# Patient Record
Sex: Male | Born: 2000 | Race: White | Hispanic: No | Marital: Single | Smoking: Never smoker
Health system: Southern US, Community
[De-identification: ages and names within clinical notes are randomized; demographics above are authoritative.]

## PROBLEM LIST (undated history)

## (undated) DIAGNOSIS — F909 Attention-deficit hyperactivity disorder, unspecified type: Secondary | ICD-10-CM

## (undated) DIAGNOSIS — H02401 Unspecified ptosis of right eyelid: Secondary | ICD-10-CM

## (undated) HISTORY — PX: STRABISMUS SURGERY: SHX218

## (undated) HISTORY — PX: PTOSIS REPAIR: SHX6568

## (undated) HISTORY — DX: Unspecified ptosis of right eyelid: H02.401

## (undated) HISTORY — DX: Attention-deficit hyperactivity disorder, unspecified type: F90.9

## (undated) HISTORY — PX: EYE MUSCLE SURGERY: SHX370

---
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
## 2021-02-23 ENCOUNTER — Ambulatory Visit: Payer: BC Managed Care – PPO | Admitting: Family

## 2021-02-23 ENCOUNTER — Other Ambulatory Visit: Payer: Self-pay

## 2021-02-23 ENCOUNTER — Encounter: Payer: Self-pay | Admitting: Family

## 2021-02-23 VITALS — BP 102/60 | HR 68 | Resp 16 | Ht 72.24 in | Wt 161.8 lb

## 2021-02-23 DIAGNOSIS — H02401 Unspecified ptosis of right eyelid: Secondary | ICD-10-CM | POA: Diagnosis not present

## 2021-02-23 DIAGNOSIS — F902 Attention-deficit hyperactivity disorder, combined type: Secondary | ICD-10-CM

## 2021-02-23 DIAGNOSIS — H547 Unspecified visual loss: Secondary | ICD-10-CM | POA: Diagnosis not present

## 2021-02-23 DIAGNOSIS — Z7189 Other specified counseling: Secondary | ICD-10-CM

## 2021-02-23 DIAGNOSIS — R278 Other lack of coordination: Secondary | ICD-10-CM

## 2021-02-23 DIAGNOSIS — Z818 Family history of other mental and behavioral disorders: Secondary | ICD-10-CM

## 2021-02-23 DIAGNOSIS — Z719 Counseling, unspecified: Secondary | ICD-10-CM

## 2021-02-23 DIAGNOSIS — Z79899 Other long term (current) drug therapy: Secondary | ICD-10-CM

## 2021-02-23 DIAGNOSIS — H509 Unspecified strabismus: Secondary | ICD-10-CM

## 2021-02-23 MED ORDER — METHYLPHENIDATE HCL ER (LA) 40 MG PO CP24: 40.0000 mg | ORAL_CAPSULE | ORAL | 0 refills | Status: DC

## 2021-02-23 MED ORDER — METHYLPHENIDATE HCL 5 MG PO TABS: ORAL_TABLET | ORAL | 0 refills | Status: DC

## 2021-02-23 NOTE — Progress Notes (Signed)
[place] [facility] [place] [facility] [address] [place] [address] Dept: [phone] Dept Fax: [phone] Loc: [phone] Loc Fax: [phone]  Medication Check  Patient ID: Daryl Allen, male  DOB: 02-17-2001, 20 y.o.  MRN: [id number]  Date of Evaluation: 02/23/2021 PCP: Eliberto Ivory, MD  Accompanied by:  self Patient Lives with: parents  HISTORY/CURRENT STATUS: HPI Patient here by himself for the visit today. Patient interactive and appropriate with provider today. Patient graduated from school and transferring to [employer] for the Fall semester. Moving in this coming week. Has been consistent with medications and taking daily his long acting Ritalin with short acting used on a PRN basis.   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   MEDICAL HISTORY: Appetite: Good   MVI/Other: None  Sleep:No issues with sleeping Concerns: Initiation/Maintenance/Other: None  Individual Medical History/ Review of Systems: Changes? :None reported. Well check in the past few months.   Allergies: Doxepin  Current Medications: Medication Side Effects: None  Family Medical/ Social History: Changes? None   MENTAL HEALTH: Mental Health Issues:  None  PHYSICAL EXAM; Vitals:  Vitals:   02/23/21 1339  BP: 102/60  Pulse: 68  Resp: 16  Weight: 161 lb 12.8 oz (73.4 kg)  Height: 6' 0.24" (1.835 m)    General Physical Exam: Unchanged from previous exam, date:12/08/2020 Changed:None  DIAGNOSES:    ICD-10-CM   1. ADHD (attention deficit hyperactivity disorder), combined type  F90.2 methylphenidate (RITALIN) 5 MG tablet    methylphenidate (RITALIN LA) 40 MG 24 hr capsule     2. Decreased visual acuity  H54.7     3. Dysgraphia  R27.8     4. Ptosis of right eyelid  H02.401     5. Strabismus  H50.9     6. Medication management  Z79.899     7. Patient counseled  Z71.9     8. Family history of suicide  Z81.8     9. Goals of care, counseling/discussion  Z71.89      ASSESSMENT: Daryl Allen is a 20 year old male with a history of ADHD and symptom control with Ritalin LA 40 mg daily and PRN 5 mg in the evening time. 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 Has had no formal services in place and is not applying for services at college. May consider a job on campus at school, but waiting until spring semester. Had issues with cousin that recently committed suicide and not currently getting counseling. To continue with medication regimen and discuss recent event with family member.   RECOMMENDATIONS:  Supreme provided information regarding school, moving in this week, campus living and preparing for the move to [facility].   Denies the need for services on campus to support learning success. Encouraged to f/u with DEC is needing formal services for his ADHD if academic struggles ensue. Extra help and learning centers are available with tutoring on campus for support.   Discussed activity and possible job on campus next semester. TO continue with routinn exercise on campus a [employer].   Supported healthy eating habits and good variety of foods. Discussed eating patterns and getting enough calories in daily with exercise. Encouraged enough fluids to include water daily with activity.  Counseled medication pharmacokinetics, options, dosage, administration, desired effects, and possible side effects.   Ritalin LA 40 mg daily, # 30 with no RF's Ritalin 5 mg in the afternoon, # 30 with no RF's RX for above e-scribed and sent to pharmacy on record   [id number], [place] - [address] [place] [place]  [address] Phone: [phone] Fax: [phone]   I discussed the assessment and treatment plan with the patient/parent. The patient/parent was provided an opportunity to ask questions and all were answered. The patient/ parent agreed with the plan and demonstrated an understanding of the instructions.   NEXT APPOINTMENT: Return in about 3 months (around 05/26/2021) for f/u visit.  Carron Curie, NP Counseling Time: 28 mins Total Contact Time: 32 mins

## 2021-02-24 ENCOUNTER — Encounter: Payer: Self-pay | Admitting: Family

## 2021-04-13 ENCOUNTER — Other Ambulatory Visit: Payer: Self-pay

## 2021-04-13 DIAGNOSIS — F902 Attention-deficit hyperactivity disorder, combined type: Secondary | ICD-10-CM

## 2021-04-13 MED ORDER — METHYLPHENIDATE HCL 5 MG PO TABS: ORAL_TABLET | ORAL | 0 refills | Status: DC

## 2021-04-13 MED ORDER — METHYLPHENIDATE HCL ER (LA) 40 MG PO CP24: 40.0000 mg | ORAL_CAPSULE | ORAL | 0 refills | Status: DC

## 2021-04-13 NOTE — Telephone Encounter (Signed)
RX for above e-scribed and sent to pharmacy on record ° °[id number] [place], [place] - [address] °[address] °[place] [address] °Phone: [phone] Fax: [phone] ° ° °

## 2021-05-25 ENCOUNTER — Other Ambulatory Visit: Payer: Self-pay

## 2021-05-25 DIAGNOSIS — F902 Attention-deficit hyperactivity disorder, combined type: Secondary | ICD-10-CM

## 2021-05-26 MED ORDER — METHYLPHENIDATE HCL ER (LA) 40 MG PO CP24: 40.0000 mg | ORAL_CAPSULE | ORAL | 0 refills | Status: DC

## 2021-05-26 NOTE — Telephone Encounter (Signed)
Ritalin LA 40 mg daily, # 30 with no RF's.RX for above e-scribed and sent to pharmacy on record  [facility] [phone] [place], [place] - [address] [place] [address] Phone: [phone] Fax: [phone]

## 2021-06-01 ENCOUNTER — Telehealth: Payer: Self-pay | Admitting: Family

## 2021-06-01 NOTE — Telephone Encounter (Signed)
  Emailed records for 02/23/21 to [email or web address].

## 2021-06-04 ENCOUNTER — Telehealth (INDEPENDENT_AMBULATORY_CARE_PROVIDER_SITE_OTHER): Payer: BC Managed Care – PPO | Admitting: Family

## 2021-06-04 ENCOUNTER — Encounter: Payer: Self-pay | Admitting: Family

## 2021-06-04 ENCOUNTER — Other Ambulatory Visit: Payer: Self-pay

## 2021-06-04 DIAGNOSIS — G4700 Insomnia, unspecified: Secondary | ICD-10-CM

## 2021-06-04 DIAGNOSIS — Z719 Counseling, unspecified: Secondary | ICD-10-CM

## 2021-06-04 DIAGNOSIS — H547 Unspecified visual loss: Secondary | ICD-10-CM

## 2021-06-04 DIAGNOSIS — F902 Attention-deficit hyperactivity disorder, combined type: Secondary | ICD-10-CM | POA: Diagnosis not present

## 2021-06-04 DIAGNOSIS — H02401 Unspecified ptosis of right eyelid: Secondary | ICD-10-CM

## 2021-06-04 DIAGNOSIS — Z7189 Other specified counseling: Secondary | ICD-10-CM

## 2021-06-04 DIAGNOSIS — H509 Unspecified strabismus: Secondary | ICD-10-CM

## 2021-06-04 DIAGNOSIS — Z79899 Other long term (current) drug therapy: Secondary | ICD-10-CM

## 2021-06-04 DIAGNOSIS — R278 Other lack of coordination: Secondary | ICD-10-CM

## 2021-06-04 NOTE — Progress Notes (Signed)
[place] [facility] [address] [place] [place] [address] Dept: [phone] Dept Fax: [phone]  Medication Check visit via Virtual Video   Patient ID:  Daryl Allen  male DOB: 2000-12-07   20 y.o.   MRN: [id number]   DATE:06/04/21  PCP: Eliberto Ivory, MD  Virtual Visit via Video Note  Daryl Allen connected with  Daryl Allen on 06/04/21 at  1:30 PM EST by a video enabled telemedicine application and verified that Daryl Allen am speaking with the correct person using two identifiers. Patient/Parent Location: at school   Daryl Allen discussed the limitations, risks, security and privacy concerns of performing an evaluation and management service by telephone and the availability of in person appointments. Daryl Allen also discussed with the parents that there may be a patient responsible charge related to this service. The parents expressed understanding and agreed to proceed.  Provider: Carron Curie, NP  Location: private work location  HPI/CURRENT STATUS: Daryl Allen is here for medication management of the psychoactive medications for ADHD and review of educational and behavioral concerns.   Daryl Allen currently taking Ritalin LA 40 mg daily, which is working well. Takes medication daily in the morning. Medication tends to wear off around evening, then can use the short acting. Daryl Allen is able to focus through school/homework.   Daryl Allen is eating well (eating breakfast, lunch and dinner). Daryl Allen does not have appetite suppression  Sleeping well (getting plenty of sleep), sleeping through the night. 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   MEDICAL HISTORY: Individual Medical History/ Review of Systems: Has had a viral infection with new  environment. Has been healthy with no visits to the PCP. WCC due Yearly.   Family Medical/ Social History: Changes? No Patient Lives with:  1 roommate that is quiet  MENTAL HEALTH: Mental Health Issues: Anxiety-less now  Allergies: Allergies  Allergen Reactions   Doxepin Other (See Comments)    Other Reaction: blurred vision   Current Medications:  Current Outpatient Medications  Medication Instructions   loratadine (CLARITIN) 10 mg, Oral, Daily   melatonin 1 mg, Oral, At bedtime PRN   methylphenidate (RITALIN LA) 40 mg, Oral, BH-each morning   methylphenidate (RITALIN) 5 MG tablet Take 1 or 2 tablets daily after school as needed for homework.   Medication Side Effects: None  DIAGNOSES:    ICD-10-CM   1. ADHD (attention deficit hyperactivity disorder), combined type  F90.2     2. Decreased visual acuity  H54.7     3. Dysgraphia  R27.8     4. Insomnia, unspecified type  G47.00     5. Ptosis, right eyelid  H02.401     6. Strabismus  H50.9     7. Medication management  Z79.899     8. Patient counseled  Z71.9     9. Goals of care, counseling/discussion  Z71.89      ASSESSMENT:     Daryl Allen is a 20 year old male with a history of ADHD and Dysgraphia that is well controlled with Ritalin LA 40 mg daily for school days with use of Ritalin 5 mg in the evening PRN. Academically doing well with no issues reported and no disability services in place for school needs. Had a hard time with adjustment to the new environment and limited support  system. Has had some viral illness with doctor visits for the first part of the school year with continued treatment of symptoms. No issues with eating or sleeping on campus. No changes to medications or dosing and will reassess at the next f/u appt.  PLAN/RECOMMENDATIONS:  Updates for school, academics, progress and adjustment to campus living this school year at [employer].  No current disability services in place and academically having no  issues. Support given.  Discussed recent ongoing viral illness with living on campus and has been to the doctors on several occasions. Encouraged to boost immune system with supplements, especially vitamin C.   Getting plenty of activity with skateboarding on campus and recreational soccer team.   Eating as well as can be expected on campus with changes in eating habits and schedule based on dining hours.   Sleep schedule has been consistent with no current difficulties reported in the dorm room setting.   Counseled medication pharmacokinetics, options, dosage, administration, desired effects, and possible side effects.   Ritalin LA 40 daily with no Rx today Ritalin 5 mg in the evening PRN, no Rx today.   Daryl Allen discussed the assessment and treatment plan with the patient.. The patient was provided an opportunity to ask questions and all were answered. The patient agreed with the plan and demonstrated an understanding of the instructions.   NEXT APPOINTMENT:  12/21/2020 for routine f/u visit Telehealth OK  The patient was advised to call back or seek an in-person evaluation if the symptoms worsen or if the condition fails to improve as anticipated.   Carron Curie, NP

## 2021-06-07 ENCOUNTER — Other Ambulatory Visit: Payer: Self-pay

## 2021-06-07 DIAGNOSIS — F902 Attention-deficit hyperactivity disorder, combined type: Secondary | ICD-10-CM

## 2021-08-05 ENCOUNTER — Other Ambulatory Visit: Payer: Self-pay

## 2021-08-05 DIAGNOSIS — F902 Attention-deficit hyperactivity disorder, combined type: Secondary | ICD-10-CM

## 2021-08-05 MED ORDER — METHYLPHENIDATE HCL ER (LA) 40 MG PO CP24: 40.0000 mg | ORAL_CAPSULE | ORAL | 0 refills | Status: DC

## 2021-08-05 NOTE — Telephone Encounter (Signed)
RX for above e-scribed and sent to pharmacy on record  [id number] [place], [place] [id number] Phone: [phone] Fax: [phone]

## 2021-09-10 ENCOUNTER — Other Ambulatory Visit: Payer: Self-pay

## 2021-09-10 DIAGNOSIS — F902 Attention-deficit hyperactivity disorder, combined type: Secondary | ICD-10-CM

## 2021-09-10 MED ORDER — METHYLPHENIDATE HCL ER (LA) 40 MG PO CP24: 40.0000 mg | ORAL_CAPSULE | ORAL | 0 refills | Status: DC

## 2021-09-10 NOTE — Telephone Encounter (Signed)
E-Prescribed Ritalin LA 40 directly to  [id number] [place], [place] - [address] [facility] [address] [place] [address] Phone: [phone] Fax: [phone]

## 2021-09-17 ENCOUNTER — Telehealth (INDEPENDENT_AMBULATORY_CARE_PROVIDER_SITE_OTHER): Payer: BC Managed Care – PPO | Admitting: Family

## 2021-09-17 ENCOUNTER — Other Ambulatory Visit: Payer: Self-pay

## 2021-09-17 ENCOUNTER — Encounter: Payer: Self-pay | Admitting: Family

## 2021-09-17 DIAGNOSIS — F902 Attention-deficit hyperactivity disorder, combined type: Secondary | ICD-10-CM | POA: Diagnosis not present

## 2021-09-17 DIAGNOSIS — H547 Unspecified visual loss: Secondary | ICD-10-CM | POA: Diagnosis not present

## 2021-09-17 DIAGNOSIS — R278 Other lack of coordination: Secondary | ICD-10-CM

## 2021-09-17 DIAGNOSIS — H509 Unspecified strabismus: Secondary | ICD-10-CM | POA: Diagnosis not present

## 2021-09-17 MED ORDER — METHYLPHENIDATE HCL ER (LA) 20 MG PO CP24: 20.0000 mg | ORAL_CAPSULE | Freq: Every day | ORAL | 0 refills | Status: DC

## 2021-09-17 NOTE — Progress Notes (Signed)
?[place] ?[facility] ?[place] ?[place] [address] ?Dept: [phone] ?Dept Fax: [phone] ? ?Medication Check visit via Virtual Video  ? ?Patient ID:  Daryl Allen  male DOB: 04-Apr-2001   21 y.o.   MRN: [id number]  ? ?DATE:09/17/21 ? ?PCP: Elnita Maxwell, MD ? ?Virtual Visit via Video Note ? ?I connected with  Conan Baptist on 09/17/21 at  9:00 AM EST by a video enabled telemedicine application and verified that I am speaking with the correct person using two identifiers. Patient/Parent Location: at school ?  ?I discussed the limitations, risks, security and privacy concerns of performing an evaluation and management service by telephone and the availability of in person appointments. I also discussed with the parents that there may be a patient responsible charge related to this service. The parents expressed understanding and agreed to proceed. ? ?Provider: Carolann Littler, NP  Location: private work location ? ?HPI/CURRENT STATUS: ?Kener Cabal is here for medication management of the psychoactive medications for ADHD and review of educational and behavioral concerns.  ? ?Danye currently taking Ritalin LA 40 mg daily most days,  which is working well. Takes medication mostly during the week and Sundays. Medication tends to wear off around evening time. Euell is able to focus through school & homework.  ? ?Cort is eating well (eating breakfast, lunch and dinner). Josia does not have appetite suppression, but not eating as he should related to school requirements.  ? ?Sleeping well (getting adequate sleep to function most nights now), sleeping through the night. Kamarr does not have delayed sleep onset. Sleeping was off due to the semesters requirements.  ? ?EDUCATION: ?School: [employer] ?Year/Grade: transfer student  ?Performance/ Grades: average ?Having issues with Math Class and having a hard time with level of difficulty.  Spanish has also been a challenge. Marland Kitchen  ?Research program this semester ?Services: Other: not getting tutoring for Math, but has to attend tutoring 2 times for Spanish and needs to set that up.  ?May take summer classes for game/art design ?Working this summer  ?10 day mission trip ? ?Activities/ Exercise:  walking on campus ? ?MEDICAL HISTORY: ?Individual Medical History/ Review of Systems: Yes, more sickness/illness with transferring to school. Not getting enough sleep, not eating enough and being stressed. Has been healthy with no visits to the PCP. [place] due yearly or as needed.  ? ?Family Medical/ Social History: Changes? None reported recently.  ?Patient Lives with: 1 roommate at school ? ?MENTAL HEALTH: ?Mental Health Issues: Stressful with transitioning to school this year with increased time commitment to clubs and other responsibilities. Feels overwhelmed by school responsibilities and 2 major emotional outbursts due to increased stress. Has not drank or used marijuana in over 3 months.  ? ?Allergies: ?Allergies  ?Allergen Reactions  ? Doxepin Other (See Comments)  ?  Other Reaction: blurred vision  ? ?Current Medications:  ?Current Outpatient Medications on File Prior to Visit  ?Medication Sig Dispense Refill  ? loratadine (CLARITIN) 10 MG tablet Take 10 mg by mouth daily.    ? Melatonin 1 MG TABS Take 1 mg by mouth at bedtime as needed.    ? methylphenidate (RITALIN LA) 40 MG 24 hr capsule Take 1 capsule (40 mg total) by mouth every morning. 30 capsule 0  ? ?No current facility-administered medications on file prior to visit.  ? ?Medication Side Effects: None ? ?DIAGNOSES:  ?  ICD-10-CM   ?1. ADHD (attention deficit hyperactivity  disorder), combined type  F90.2   ?  ?2. Decreased visual acuity  H54.7   ?  ?3. Dysgraphia  R27.8   ?  ?4. Strabismus  H50.9   ?  ? ? ?ASSESSMENT:      ?Giann is a 21 year old male with a history of ADHD, Dysgraphia and anxiety. He had been well maintained on his Ritalin LA 40 mg  daily with more anxiety recently. Academically struggliing with the difficulty level of the classes this semester. More anxiety causing from the classes that he is not eating or sleeping well which is causing more issues with coping, Waymon has also participating with increased on campus activities and clubs along with research program on campus. He is feeling overwhelmed and had 2 significant emotional "breakdown" due to these feelings. Not currently receiving counseling and mother concerned with his medications. Discussion of current medications and alternative therapies for treatment of current emotional dysregulation.  ? ?PLAN/RECOMMENDATIONS:  ?Updates for school, academic difficulties, classes and current grades. ? ?Not receiving disability services on campus. Tutoring is available and not currently using services. ? ?Discussed difficulty level of classes and provided support with limiting these classes in the same semester.  ? ?May consider taking summer classes to alleviate some pressure next year with support given.  ? ?Emotional dysregulation discussed and encouraged to seek counseling on campus. If not successful may consider contacting insurance for therapy coverage.  ? ?Eating habits discussed due to limited time with class demands and encouraged smaller meals/snacks that can be taken on the way to/from classes. ? ?Sleep schedule discussed with inconsistencies and limited time due to class demands.. Encouraged sleep hygiene and better sleep habits.  ? ?Medication discussed due to concerns. NO changes with medication but may consider reducing dose to see if that will assist with decreasing anxiety level.  ? ?Counseled medication pharmacokinetics, options, dosage, administration, desired effects, and possible side effects.   ?Ritalin LA 40 mg daily most school days, NO Rx today ?Ritalin 5 mg daily, not taking-D/C'd ?Ritalin LA 20 mg for shorter days, # 30 with no RF's ?RX for above e-scribed and sent to  pharmacy on record ? ?[id number] [place] [place] [address] ?[place] ?[place] [address] ?Phone: [phone] Fax: [phone] ?  ?I discussed the assessment and treatment plan with the patient. The patient was provided an opportunity to ask questions and all were answered. The patient agreed with the plan and demonstrated an understanding of the instructions. ?  ?NEXT APPOINTMENT:  ?12/21/2021-f/u visit ?Telehealth OK ? ?The patient was advised to call back or seek an in-person evaluation if the symptoms worsen or if the condition fails to improve as anticipated. ? ? ?Carolann Littler, NP ? ?

## 2021-09-20 ENCOUNTER — Encounter: Payer: Self-pay | Admitting: Family

## 2021-10-21 ENCOUNTER — Other Ambulatory Visit: Payer: Self-pay

## 2021-10-21 DIAGNOSIS — F902 Attention-deficit hyperactivity disorder, combined type: Secondary | ICD-10-CM

## 2021-10-22 MED ORDER — METHYLPHENIDATE HCL ER (LA) 20 MG PO CP24: 20.0000 mg | ORAL_CAPSULE | Freq: Every day | ORAL | 0 refills | Status: DC

## 2021-10-22 MED ORDER — METHYLPHENIDATE HCL ER (LA) 40 MG PO CP24: 40.0000 mg | ORAL_CAPSULE | ORAL | 0 refills | Status: DC

## 2021-10-22 NOTE — Telephone Encounter (Signed)
Ritalin LA 40 mg daily, # 30 with no RF's and Ritalin LA 20 mg daily, # 30 with no RF's.RX for above e-scribed and sent to pharmacy on record ? ?[id number] [place], [place] - [address] ?[address] ?[place] [address] ?Phone: [phone] Fax: [phone] ? ? ?

## 2021-12-21 ENCOUNTER — Ambulatory Visit (INDEPENDENT_AMBULATORY_CARE_PROVIDER_SITE_OTHER): Payer: BC Managed Care – PPO | Admitting: Family

## 2021-12-21 ENCOUNTER — Encounter: Payer: Self-pay | Admitting: Family

## 2021-12-21 VITALS — BP 122/64 | HR 72 | Resp 16 | Ht 73.0 in | Wt 173.0 lb

## 2021-12-21 DIAGNOSIS — H02401 Unspecified ptosis of right eyelid: Secondary | ICD-10-CM

## 2021-12-21 DIAGNOSIS — Z79899 Other long term (current) drug therapy: Secondary | ICD-10-CM

## 2021-12-21 DIAGNOSIS — H509 Unspecified strabismus: Secondary | ICD-10-CM

## 2021-12-21 DIAGNOSIS — F419 Anxiety disorder, unspecified: Secondary | ICD-10-CM

## 2021-12-21 DIAGNOSIS — F902 Attention-deficit hyperactivity disorder, combined type: Secondary | ICD-10-CM

## 2021-12-21 DIAGNOSIS — H547 Unspecified visual loss: Secondary | ICD-10-CM | POA: Diagnosis not present

## 2021-12-21 DIAGNOSIS — R278 Other lack of coordination: Secondary | ICD-10-CM | POA: Diagnosis not present

## 2021-12-21 DIAGNOSIS — Z719 Counseling, unspecified: Secondary | ICD-10-CM

## 2021-12-21 DIAGNOSIS — Z7189 Other specified counseling: Secondary | ICD-10-CM

## 2021-12-21 MED ORDER — METHYLPHENIDATE HCL ER (LA) 20 MG PO CP24: 20.0000 mg | ORAL_CAPSULE | Freq: Every day | ORAL | 0 refills | Status: DC

## 2021-12-21 NOTE — Patient Instructions (Signed)
Counseling Groups in the area  [facility]-[phone]  [facility]- [phone]  [facility]- [phone]  [facility]- [phone]  [facility]-[phone]

## 2021-12-21 NOTE — Progress Notes (Signed)
[facility] [address] [place] [address] Dept: [phone] Dept Fax: [phone] Loc: [phone] Loc Fax: [phone]  Medication Check  Patient ID: Daryl Allen, male  DOB: April 30, 2001, 21 y.o.  MRN: [id number]  Date of Evaluation: 12/21/2021 PCP: Elnita Maxwell, MD  Accompanied by:  self Patient Lives with: parents at home  HISTORY/CURRENT STATUS: HPI Patient here by himself for the visit today. Interactive and appropriate with provider. Academically struggle last semester with level of difficulty of classes and transitioning to being on his own. Daryl Allen has continued with Ritalin LA 40 mg and 20 mg each depending on the day. Not using IR recently, but having no concerns or issues with his medication.   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 :None reported  Allergies: Doxepin  Current Medications:  Current Outpatient Medications  Medication Instructions   loratadine (CLARITIN) 10 mg, Oral, Daily   melatonin 1 mg, Oral, At bedtime PRN   methylphenidate (RITALIN LA) 40 mg, Oral, BH-each morning   methylphenidate (RITALIN LA) 20 mg, Oral, Daily   Medication Side Effects: None Family Medical/ Social History: Changes? Yes recent treatment of anxiety by brother.   MENTAL HEALTH: Mental Health Issues: Anxiety-no changes, but still having  some thought process and insecurities.   PHYSICAL EXAM; Vitals: Vitals:   12/21/21 1014  BP: 122/64  Pulse: 72  Resp: 16  Weight: 173 lb (78.5 kg)  Height: 6\' 1"  (1.854 m)    General Physical Exam: Unchanged from previous exam, date:09/17/2021 Changed:None  DIAGNOSES:    ICD-10-CM   1. ADHD (attention deficit hyperactivity disorder), combined type  F90.2     2. Decreased visual acuity  H54.7     3. Dysgraphia  R27.8     4. Ptosis, right eyelid  H02.401     5. Strabismus  H50.9     6. Anxiety  F41.9     7. Medication management  Z79.899     8. Patient counseled  Z71.9     9. Goals of care, counseling/discussion  Z71.89      ASSESSMENT: Daryl Allen is a 21 year old male with a history of ADHD, Dysgraphia and recent anxiety. Currently on Ritalin LA 20 mg now and taking 40 mg as needed at school. Good efficacy with medication and no side effects. Academically passed this past year and will only take 4 classes in the fall. Support can be obtained but not in place at school. Not taking summer classes. 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 Eating less while at home, but getting in a variety of foods. No changes with health and has routine visits scheduled. Sleeping on a better schedule. Some anxiety and issues with expectations along with insecurities. No medication changes and recommendations of counseling groups to be provided to patient.   RECOMMENDATIONS:  School, progress, academics, and current grades for last semester discussed.   No formal services in place at college but tutoring  is available on campus.   Interning this summer with his church and attending a mission trip to [place].   Discussed anxiety and recent issues with self confidence and insecurities.  Recommended counseling to assist with current relationship difficulties at home and emotional dysregulation.  Reviewed eating pattern with changes at home with eating  less, but still getting in enough calories daily.   Sleep schedule has been more consistent at home with internship this summer.   Counseled medication pharmacokinetics, options, dosage, administration, desired effects, and possible side effects.   Ritalin LA 20 mg daily, summer time<, # 30 with no RF's Ritalin LA 40 mg daily, no Rx today RX for above e-scribed and sent to pharmacy on record  [id number] [place] [place] [id number] Phone: [phone] Fax: [phone]  I discussed the assessment and treatment plan with the patient. The patient was provided an opportunity to ask questions and all were answered. The patient agreed with the plan and demonstrated an understanding of the instructions.  NEXT APPOINTMENT: Return in about 3 months (around 03/23/2022) for f/u visit .  The patient was advised to call back or seek an in-person evaluation if the symptoms worsen or if the condition fails to improve as anticipated.  Carolann Littler, NP

## 2021-12-24 ENCOUNTER — Ambulatory Visit: Payer: Self-pay | Admitting: Family Medicine

## 2022-02-01 ENCOUNTER — Encounter: Payer: Self-pay | Admitting: Family

## 2022-02-01 ENCOUNTER — Ambulatory Visit: Payer: BC Managed Care – PPO | Admitting: Family

## 2022-02-01 VITALS — BP 124/77 | HR 93 | Temp 98.4°F | Ht 73.0 in | Wt 177.1 lb

## 2022-02-01 DIAGNOSIS — H50611 Brown's sheath syndrome, right eye: Secondary | ICD-10-CM | POA: Insufficient documentation

## 2022-02-01 DIAGNOSIS — F413 Other mixed anxiety disorders: Secondary | ICD-10-CM | POA: Diagnosis not present

## 2022-02-01 DIAGNOSIS — L700 Acne vulgaris: Secondary | ICD-10-CM | POA: Insufficient documentation

## 2022-02-01 MED ORDER — SERTRALINE HCL 25 MG PO TABS: 25.0000 mg | ORAL_TABLET | Freq: Every day | ORAL | 0 refills | Status: DC

## 2022-02-01 NOTE — Assessment & Plan Note (Addendum)
New - pt with mother today as pt is very nervous to discuss current anxiety. She reports he has OCD behaviors that have occurred for years, checking doors, excessive cleanliness after using bathroom. 1.5 years ago pt cousin attempted suicide and pt found him, called 911. 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 pt reports having increased anxiety at random moments and is unsure as to why. Also reports having new, "intrusive thoughts" regarding sexual activity, with men & women. Mom states these thoughts are very upsetting to him and why he finds it hard to discuss d/t his religious faith. Pt agrees with what all mom has relayed. Advised on importance of finding a good psychotherapist. I will start Zoloft to treat anxiety & OCD, but he needs a full eval asap. He sees a therapist for his ADHD, who has made him a psych referral. Advised on use & SE of medication. Pt younger brother also recently started same medication. F/U in 2 weeks.

## 2022-02-01 NOTE — Progress Notes (Signed)
New Patient Office Visit  Subjective:  Patient ID: Daryl Allen, male    DOB: 07/31/2000  Age: 21 y.o. MRN: [id number]  CC:  Chief Complaint  Patient presents with   Establish Care   Anxiety    Pt states he is very anxious and over thinks. Pt states he has thoughts he would not like to share.    Insomnia    Pt states he states he is not able to sleep due to over thinking.     HPI Daryl Allen presents for establishing care today.  *Patient is accompanied by their mother today, who is helping to provide HPI/medical information.  Anxiety/Depression: Patient complains of anxiety disorder, obsessive compulsive disorder, and sleep disturbance.   He has the following symptoms: feelings of losing control, insomnia, racing thoughts, sweating and intrusive sexual thoughts. Onset of symptoms was approximately 3 months ago, He denies current suicidal and homicidal ideation.  Possible organic causes contributing are: none.  Risk factors: positive family history in  brother(s) and father; possible PTSD after finding/saving his cousin after a suicide attempt. Previous treatment includes  no medications  and individual therapy.  Sees a behavioral health provider for ADHD and she has provided him with psychotherapist names, he has called and scheduled an appointment.     02/01/2022   10:28 AM  Depression screen PHQ 2/9  Decreased Interest 0  Down, Depressed, Hopeless 1  PHQ - 2 Score 1  Altered sleeping 3  Tired, decreased energy 1  Change in appetite 0  Feeling bad or failure about yourself  2  Trouble concentrating 0  Moving slowly or fidgety/restless 0  Suicidal thoughts 0  PHQ-9 Score 7  Difficult doing work/chores Somewhat difficult      02/01/2022   10:28 AM  GAD 7 : Generalized Anxiety Score  Nervous, Anxious, on Edge 2  Control/stop worrying 3  Worry too much - different things 2  Trouble relaxing 2  Restless 0  Easily annoyed or irritable 0  Afraid - awful might happen 0   Total GAD 7 Score 9  Anxiety Difficulty Somewhat difficult     Assessment & Plan:   Problem List Items Addressed This Visit       Other   Other mixed anxiety disorders - Primary    New - pt with mother today as pt is very nervous to discuss current anxiety. She reports he has OCD behaviors that have occurred for years, checking doors, excessive cleanliness after using bathroom. 1.5 years ago pt cousin attempted suicide and pt found him, called 911. 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 pt reports having increased anxiety at random moments and is unsure as to why. Also reports having new, "intrusive thoughts" regarding sexual activity, with men & women. Mom states these thoughts are very upsetting to him and why he finds it hard to discuss d/t his religious faith. Pt agrees with what all mom has relayed. Advised on importance of finding a good psychotherapist. I will start Zoloft to treat anxiety & OCD, but he needs a full eval asap. He sees a therapist for his ADHD, who has made him a psych referral. Advised on use & SE of medication. Pt younger brother also recently started same medication. F/U in 2 weeks.      Relevant Medications   sertraline (ZOLOFT) 25 MG tablet    Subjective:    Outpatient Medications Prior to Visit  Medication Sig Dispense Refill   loratadine (CLARITIN) 10 MG tablet Take 10 mg by mouth daily.     Melatonin 1 MG TABS Take 1 mg by mouth at bedtime as needed.     methylphenidate (RITALIN LA) 20 MG 24 hr capsule Take 1 capsule (20 mg total) by mouth daily. 30 capsule 0   methylphenidate (RITALIN LA) 40 MG 24 hr capsule Take 1 capsule (40 mg total) by mouth every morning. 30 capsule 0   No facility-administered medications prior to visit.   Past Medical History:  Diagnosis Date   ADHD    Ptosis 11/21/2019   Formatting of this note might be different from the original.  08/13/2007--REVISION OF RIGHT FRONTALIS SLING   Ptosis of eyelid, right    Past Surgical History:  Procedure Laterality Date   EYE MUSCLE SURGERY     PTOSIS REPAIR     STRABISMUS SURGERY      Objective:   Today's Vitals: BP 124/77 (BP Location: Left Arm, Patient Position: Sitting, Cuff Size: Large)   Pulse 93   Temp 98.4 F (36.9 C) (Temporal)   Ht 6\' 1"  (1.854 m)   Wt 177 lb 2 oz (80.3 kg)   SpO2 99%   BMI 23.37 kg/m   Physical Exam Vitals and nursing note reviewed.  Constitutional:      General: He is not in acute distress.    Appearance: Normal appearance.  HENT:     Head: Normocephalic.  Eyes:     Comments: Right upper eyelid ptosis  Cardiovascular:     Rate and Rhythm: Normal rate and regular rhythm.  Pulmonary:     Effort: Pulmonary effort is normal.     Breath sounds: Normal breath sounds.  Musculoskeletal:        General: Normal range of motion.     Cervical back: Normal range of motion.  Skin:    General: Skin is warm and dry.  Neurological:     Mental Status: He is alert and oriented to person, place, and time.  Psychiatric:        Mood and Affect: Mood normal.     , NP

## 2022-02-01 NOTE — Patient Instructions (Signed)
Welcome to [facility], It was a pleasure meeting you today!    As discussed, I have sent a low dose of generic Zoloft to your pharmacy, start this in the am, but if causes any sleepiness you can take in the evening.  Let me know if you need a referral for psychiatry.  Please schedule a 2 week follow up visit today and we can do this via a virtual visit or in office.      PLEASE NOTE: If you had any LAB tests please let us know if you have not heard back within a few days. You may see your results on MyChart before we have a chance to review them but we will give you a call once they are reviewed by [place]. If we ordered any REFERRALS today, please let us know if you have not heard from their office within the next week.  Let us know through MyChart if you are needing REFILLS, or have your pharmacy send [place] the request. You can also use MyChart to communicate with me or any office staff.

## 2022-02-25 ENCOUNTER — Ambulatory Visit: Payer: Self-pay | Admitting: Nurse Practitioner

## 2022-03-01 ENCOUNTER — Other Ambulatory Visit: Payer: Self-pay | Admitting: Family

## 2022-03-01 DIAGNOSIS — F413 Other mixed anxiety disorders: Secondary | ICD-10-CM

## 2022-03-02 ENCOUNTER — Ambulatory Visit: Payer: BC Managed Care – PPO | Admitting: Family

## 2022-03-02 ENCOUNTER — Encounter: Payer: Self-pay | Admitting: Family

## 2022-03-02 DIAGNOSIS — F413 Other mixed anxiety disorders: Secondary | ICD-10-CM

## 2022-03-02 MED ORDER — SERTRALINE HCL 50 MG PO TABS: 50.0000 mg | ORAL_TABLET | Freq: Every day | ORAL | 1 refills | Status: DC

## 2022-03-02 NOTE — Assessment & Plan Note (Addendum)
   New  tolerating 25mg  Zoloft, takes at qhs, not at goal for sx  increasing dose to 50mg , sending refill  advised to make appt for psychiatrist, but pt leaving to go back to college in [place] next week  pt plans to smoke weed back at school, advised to keep to a minimum, should not interact w/med, but alcohol can  f/u in 3 mos - ok if virtual

## 2022-03-02 NOTE — Progress Notes (Signed)
Patient ID: Daryl Allen, male    DOB: [birth year], 21 y.o.   MRN: [id number]  Chief Complaint  Patient presents with   Anxiety    Pt states his anxiety has been good.     HPI: Anxiety/Depression: Patient complains of anxiety disorder, obsessive compulsive disorder, and sleep disturbance. He has the following symptoms: feelings of losing control, insomnia, racing thoughts, sweating and intrusive sexual thoughts. Onset of symptoms was approximately 3 months ago, He denies current suicidal and homicidal ideation. Risk factors: positive family history in  brother(s) and father; possible PTSD after finding/saving his cousin after a suicide attempt. Previous treatment includes no medications and individual therapy.  Sees a behavioral health provider for ADHD and she has provided him with psychotherapist names, he has not called for an appointment yet. Reports starting Zoloft last visit and tolerating.   Assessment & Plan:   Problem List Items Addressed This Visit       Other   Other mixed anxiety disorders    New tolerating 25mg  Zoloft, takes at qhs, not at goal for sx increasing dose to 50mg , sending refill advised to make appt for psychiatrist, but pt leaving to go back to college in [place] next week pt plans to smoke weed back at school, advised to keep to a minimum, should not interact w/med, but alcohol can f/u in 3 mos - ok if virtual      Relevant Medications   sertraline (ZOLOFT) 50 MG tablet    Subjective:    Outpatient Medications Prior to Visit  Medication Sig Dispense Refill   loratadine (CLARITIN) 10 MG tablet Take 10 mg by mouth daily.     Melatonin 1 MG TABS Take 1 mg by mouth at bedtime as needed.     methylphenidate (RITALIN LA) 20 MG 24 hr capsule Take 1 capsule (20 mg total) by mouth daily. 30 capsule 0   methylphenidate (RITALIN LA) 40 MG 24 hr capsule Take 1 capsule (40 mg total) by mouth every morning. 30 capsule 0   sertraline (ZOLOFT) 25 MG tablet Take 1  tablet (25 mg total) by mouth daily. 30 tablet 0   No facility-administered medications prior to visit.   Past Medical History:  Diagnosis Date   ADHD    Ptosis 11/21/2019   Formatting of this note might be different from the original. 08/13/2007--REVISION OF RIGHT FRONTALIS SLING   Ptosis of eyelid, right    Past Surgical History:  Procedure Laterality Date   EYE MUSCLE SURGERY     PTOSIS REPAIR     STRABISMUS SURGERY     No Known Allergies    Objective:    Physical Exam Vitals and nursing note reviewed.  Constitutional:      General: He is not in acute distress.    Appearance: Normal appearance.  HENT:     Head: Normocephalic.  Cardiovascular:     Rate and Rhythm: Normal rate and regular rhythm.  Pulmonary:     Effort: Pulmonary effort is normal.     Breath sounds: Normal breath sounds.  Musculoskeletal:        General: Normal range of motion.     Cervical back: Normal range of motion.  Skin:    General: Skin is warm and dry.  Neurological:     Mental Status: He is alert and oriented to person, place, and time.  Psychiatric:        Mood and Affect: Mood normal.    BP 117/77 (BP Location: Left Arm,  Patient Position: Sitting, Cuff Size: Large)   Pulse 69   Temp 97.8 F (36.6 C) (Temporal)   Ht 6\' 1"  (1.854 m)   Wt 176 lb 3.2 oz (79.9 kg)   SpO2 100%   BMI 23.25 kg/m  Wt Readings from Last 3 Encounters:  03/02/22 176 lb 3.2 oz (79.9 kg)  02/01/22 177 lb 2 oz (80.3 kg)  05/29/14 90 lb 11.2 oz (41.1 kg) (41 %, Z= -0.24)*   * Growth percentiles are based on CDC (Boys, 2-20 Years) data.     13/12/15, NP

## 2022-03-02 NOTE — Patient Instructions (Signed)
It was very nice to see you today!   I have sent a refill of your medication, Zoloft, to your pharmacy. I increased the dose to 50mg . If this is too strong for you, it is ok to cut in half and take 1/2 pill if needed.   Give it 2-4 weeks to feel full benefit with increasing the dose.  Schedule a 3 month follow up visit, this can be done virtually or in-office.  Have a great semester back at school!     PLEASE NOTE:  If you had any lab tests please let know if you have not heard back within a few days. You may see your results on MyChart before we have a chance to review them but we will give you a call once they are reviewed by [place]. If we ordered any referrals today, please let us know if you have not heard from their office within the next week.

## 2022-04-08 ENCOUNTER — Other Ambulatory Visit: Payer: Self-pay

## 2022-04-08 DIAGNOSIS — F902 Attention-deficit hyperactivity disorder, combined type: Secondary | ICD-10-CM

## 2022-04-08 MED ORDER — METHYLPHENIDATE HCL ER (LA) 40 MG PO CP24: 40.0000 mg | ORAL_CAPSULE | ORAL | 0 refills | Status: DC

## 2022-04-08 MED ORDER — METHYLPHENIDATE HCL ER (LA) 20 MG PO CP24: 20.0000 mg | ORAL_CAPSULE | Freq: Every day | ORAL | 0 refills | Status: DC

## 2022-04-08 NOTE — Telephone Encounter (Signed)
Ritalin LA 40 mg daily # 30 with no RF's and Ritalin LA 20  mg daily, # 30 with no RF's.Grayce Sessions for above e-scribed and sent to pharmacy on record  [id number] [place], [place] [address] Phone: [phone] Fax: [phone]

## 2022-04-11 ENCOUNTER — Telehealth (INDEPENDENT_AMBULATORY_CARE_PROVIDER_SITE_OTHER): Payer: BC Managed Care – PPO | Admitting: Family

## 2022-04-11 ENCOUNTER — Encounter: Payer: Self-pay | Admitting: Family

## 2022-04-11 DIAGNOSIS — H02401 Unspecified ptosis of right eyelid: Secondary | ICD-10-CM

## 2022-04-11 DIAGNOSIS — F902 Attention-deficit hyperactivity disorder, combined type: Secondary | ICD-10-CM

## 2022-04-11 DIAGNOSIS — Z719 Counseling, unspecified: Secondary | ICD-10-CM

## 2022-04-11 DIAGNOSIS — Z79899 Other long term (current) drug therapy: Secondary | ICD-10-CM

## 2022-04-11 DIAGNOSIS — F413 Other mixed anxiety disorders: Secondary | ICD-10-CM | POA: Diagnosis not present

## 2022-04-11 DIAGNOSIS — H547 Unspecified visual loss: Secondary | ICD-10-CM

## 2022-04-11 DIAGNOSIS — R278 Other lack of coordination: Secondary | ICD-10-CM

## 2022-04-11 DIAGNOSIS — Z7189 Other specified counseling: Secondary | ICD-10-CM

## 2022-04-11 MED ORDER — METHYLPHENIDATE HCL 10 MG PO TABS: 10.0000 mg | ORAL_TABLET | Freq: Every day | ORAL | 0 refills | Status: DC

## 2022-04-11 NOTE — Progress Notes (Signed)
[facility]. [address] [place] [address] Dept: [phone] Dept Fax: [phone]  Medication Check visit via Virtual Video   Patient ID:  Daryl Allen  male DOB: 05/14/2001   21 y.o.   MRN: [id number]   DATE:04/11/22  PCP: Jeanie Sewer, NP  Virtual Visit via Video Note  I connected with  Gregori Abril on 04/11/22 at  2:00 PM EDT by a video enabled telemedicine application and verified that I am speaking with the correct person using two identifiers. Patient/Parent Location: at school in his dorm.  I discussed the limitations, risks, security and privacy concerns of performing an evaluation and management service by telephone and the availability of in person appointments. I also discussed with the parents that there may be a patient responsible charge related to this service. The parents expressed understanding and agreed to proceed.  Provider: Carolann Littler, NP  Location: private work location.   HPI/CURRENT STATUS: Daryl Allen is here for medication management of the psychoactive medications for ADHD and review of educational and behavioral concerns.   Travanti currently taking Ritalin LA  40 mg daily and Zoloft 50 mg daily, which is working well. Takes medication as directed daily.  Medication tends to wear off around evening. Tykee is able to focus through school and homework.   Remon is eating well (eating breakfast, lunch and dinner). Jonpaul has does not have appetite suppression and   Sleeping well (goes to bed at 2400-0200 wakes at 0800-0830 until 1100), sleeping through the night. Jarquis does not have delayed sleep onset and taking 1 mg Melatonin.   EDUCATION: School: [employer]  Year/Grade:  college   Performance/ Grades:  passing all but 1 class Services: tutoring as needed at school, none right now.  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   MEDICAL HISTORY: Individual Medical History/ Review of Systems: Yes, recent injury to his foot with fall from skateboard. Has been healthy with no visits to the PCP. Wilkes due yearly .   Family Medical/ Social History: None reported Patient Lives with:3  roommates  in an on campus apartment.  MENTAL HEALTH: Mental Health Issues: Anxiety recently with Zoloft 50 mg daily with   Allergies: No Known Allergies  Current Medications:  Current Outpatient Medications  Medication Instructions   loratadine (CLARITIN) 10 mg, Oral, Daily   melatonin 1 mg, Oral, At bedtime PRN   methylphenidate (RITALIN LA) 40 mg, Oral, BH-each morning   methylphenidate (RITALIN LA) 20 mg, Oral, Daily   methylphenidate (RITALIN) 10 mg, Oral, Daily after lunch   sertraline (ZOLOFT) 50 mg, Oral, Daily   Medication Side Effects: None  DIAGNOSES:    ICD-10-CM   1. ADHD (attention deficit hyperactivity disorder), combined type  F90.2     2. Dysgraphia  R27.8     3. Other mixed anxiety disorders  F41.3     4. Ptosis, right eyelid  H02.401     5. Decreased visual acuity  H54.7     6. Medication management  Z79.899     7. Patient counseled  Z71.9     8. Goals of care, counseling/discussion  Z71.89      ASSESSMENT:      Daryl Allen is a 21 year old male with a history of ADHD and Dysgraphia. More recently has shown more signs of anxiety, but not treated up until  recently. He had been on Ritalin LA 40 mg daily for school and using Ritalin 5 mg 1-2 in the afternoon for homework. PCP recently prescribed Zoloft 50 mg for anxiety and OCD features. Patient reports limited efficacy with this medication and/or dose. Not currently in counseling as previously recommended. Taking a full course load for the fall semester with increased difficulty with his online course and not currently getting help. No formal services in place but tutoring is available.  Participating in film club, the gym, socializing, and skateboarding. Eating has not changed and getting enough sleep. Medication discussed with no changes for the Ritalin LA or short acting Ritalin.   PLAN/RECOMMENDATIONS:  School classes for the fall semester reviewed along with current academic updates.   Current difficulties with academics and need for tutoring discussed.   No formal services in place for learning support on campus.  Counseling discussed with patient and encouraged to revisit the thought of counseling due to anxiety.  Visually map out his month for class schedule and assignments, social activities, and other daily needs to look at fitting in therapy to his weekly plan.   Supported continued interactions and activities with positive socializing on campus.  Sleep hygiene and sleep schedule discussed with good results. Supported continued use of Melatonin for initiation.   Discussed Zoloft 50 mg dose with limited efficacy and may need dose adjustment. May need to contact PCP who prescribed the medication.   Medication management with current dose of Ritalin LA 40 mg for school and used 20 mg over the summer. No changes with PRN use of Ritalin in the afternoon.   Counseled medication pharmacokinetics, options, dosage, administration, desired effects, and possible side effects.   Zoloft 50 mg daily, Prescribed by another provider Ritalin LA 40 mg daily no Rx today Ritalin LA 20 mg for summer no Rx today Ritalin 10 mg for PRN in the evening time, # 30 with no RF's RX for above e-scribed and sent to pharmacy on record  [id number] [place], [place] [id number] Phone: [phone] Fax: [phone]  I discussed the assessment and treatment plan with the patient. The patient was provided an opportunity to ask questions and all were answered. The patient agreed with the plan and  demonstrated an understanding of the instructions.   NEXT APPOINTMENT:  Visit date not found-f/u visit  Telehealth OK  The patient was advised to call back or seek an in-person evaluation if the symptoms worsen or if the condition fails to improve as anticipated.  Carolann Littler, NP

## 2022-04-12 ENCOUNTER — Encounter: Payer: Self-pay | Admitting: Family

## 2022-05-18 ENCOUNTER — Telehealth: Payer: Self-pay | Admitting: Family

## 2022-05-18 DIAGNOSIS — F902 Attention-deficit hyperactivity disorder, combined type: Secondary | ICD-10-CM

## 2022-05-18 NOTE — Telephone Encounter (Signed)
Pt declined visit to see PCP.  Pt states: -He wants to get off of Sertraline and thinks that his PCP is supposed to direct the pace of weaning.    Pt requests: #1 PCP team to send Rx with lower dosage to pharmacy listed below.  #2 Call back about this.    [facility] [phone] [address],  [place], [place] [address] [phone]

## 2022-05-19 MED ORDER — METHYLPHENIDATE HCL ER (LA) 40 MG PO CP24: 40.0000 mg | ORAL_CAPSULE | ORAL | 0 refills | Status: DC

## 2022-05-19 NOTE — Telephone Encounter (Signed)
Ritalin LA 40 mg daily, #30 with no RF's.RX for above e-scribed and sent to pharmacy on record  [id number] [place], [place] [address] Phone: [phone] Fax: [phone]

## 2022-05-19 NOTE — Telephone Encounter (Signed)
Dad called in a RX refill request for Methyl ER 40mg  only. They do not need the other RX.  [facility] [address] [facility] [place]. [place]

## 2022-05-20 NOTE — Telephone Encounter (Signed)
Please call Daryl Allen - why does he want to stop the Zoloft? Is he having side effects? would prefer he have a visit to discuss or can he do a virtual visit?   Let me know, thanks

## 2022-05-23 NOTE — Telephone Encounter (Signed)
Lvm in regards.

## 2022-05-25 ENCOUNTER — Encounter: Payer: Self-pay | Admitting: Family

## 2022-05-25 ENCOUNTER — Telehealth (INDEPENDENT_AMBULATORY_CARE_PROVIDER_SITE_OTHER): Payer: BC Managed Care – PPO | Admitting: Family

## 2022-05-25 VITALS — Ht 73.0 in | Wt 176.0 lb

## 2022-05-25 DIAGNOSIS — F413 Other mixed anxiety disorders: Secondary | ICD-10-CM | POA: Diagnosis not present

## 2022-05-25 MED ORDER — SERTRALINE HCL 25 MG PO TABS: 25.0000 mg | ORAL_TABLET | Freq: Every day | ORAL | 0 refills | Status: DC

## 2022-05-25 NOTE — Progress Notes (Signed)
MyChart Video Visit    Virtual Visit via Video Note   This format is felt to be most appropriate for this patient at this time. Physical exam was limited by quality of the video and audio technology used for the visit. CMA was able to get the patient set up on a video visit.  Patient location: Home. Patient and provider in visit Provider location: Office  I discussed the limitations of evaluation and management by telemedicine and the availability of in person appointments. The patient expressed understanding and agreed to proceed.  Visit Date: 05/25/2022  Today's healthcare provider: Jeanie Sewer, NP     Subjective:   Patient ID: Daryl Allen, male    DOB: 2001-02-01, 21 y.o.   MRN: [id number]  Chief Complaint  Patient presents with   Anxiety    Discuss Zoloft.    HPI Anxiety/Depression: Patient complains of anxiety disorder, obsessive compulsive disorder, and sleep disturbance. He has the following symptoms: feelings of losing control, insomnia, racing thoughts, sweating and intrusive sexual thoughts. Onset of symptoms was approximately 3 months ago, He denies current suicidal and homicidal ideation. Risk factors: positive family history in  brother(s) and father; possible PTSD after finding/saving his cousin after a suicide attempt. Previous treatment includes no medications and individual therapy. Sees a behavioral health provider for ADHD and she has provided him with psychotherapist names, he has not called for an appointment yet. Pt states he wants to wean off the Zoloft 50mg , has taken for 4 months and feels he no longer needs, wants to wean off.  Assessment & Plan:   Problem List Items Addressed This Visit       Other   Other mixed anxiety disorders - Primary    chronic pt requesting to wean off Zoloft 50mg , reports taking for 4 months now and feels like he is doing better, doing well in college denies SE as reason for stopping has not seen a therapist -  advised this is very important d/t his past PTSD to have someone he can go to, talk things over with, especially now going off medication - advised to utilize counseling center on campus, usually a free service. sending 25mg  Zoloft - advised to take daily for 7 days, then stop, if any SE, restart and take 1 pill qod for 14 more days and let me know if more medication needed. f/u prn      Relevant Medications   sertraline (ZOLOFT) 25 MG tablet    Past Medical History:  Diagnosis Date   ADHD    Ptosis 11/21/2019   Formatting of this note might be different from the original. 08/13/2007--REVISION OF RIGHT FRONTALIS SLING   Ptosis of eyelid, right     Past Surgical History:  Procedure Laterality Date   EYE MUSCLE SURGERY     PTOSIS REPAIR     STRABISMUS SURGERY      Outpatient Medications Prior to Visit  Medication Sig Dispense Refill   loratadine (CLARITIN) 10 MG tablet Take 10 mg by mouth daily.     Melatonin 1 MG TABS Take 1 mg by mouth at bedtime as needed.     methylphenidate (RITALIN LA) 20 MG 24 hr capsule Take 1 capsule (20 mg total) by mouth daily. 30 capsule 0   methylphenidate (RITALIN LA) 40 MG 24 hr capsule Take 1 capsule (40 mg total) by mouth every morning. 30 capsule 0   methylphenidate (RITALIN) 10 MG tablet Take 1 tablet (10 mg total) by mouth daily  after lunch. 30 tablet 0   sertraline (ZOLOFT) 50 MG tablet Take 1 tablet (50 mg total) by mouth daily. 90 tablet 1   No facility-administered medications prior to visit.    No Known Allergies     Objective:   Physical Exam Vitals and nursing note reviewed.  Constitutional:      General: Pt is not in acute distress.    Appearance: Normal appearance.  HENT:     Head: Normocephalic.  Pulmonary:     Effort: No respiratory distress.  Musculoskeletal:     Cervical back: Normal range of motion.  Skin:    General: Skin is dry.     Coloration: Skin is not pale.  Neurological:     Mental Status: Pt is alert and  oriented to person, place, and time.  Psychiatric:        Mood and Affect: Mood normal.   Ht 6\' 1"  (1.854 m)   Wt 176 lb (79.8 kg)   BMI 23.22 kg/m   Wt Readings from Last 3 Encounters:  05/25/22 176 lb (79.8 kg)  03/02/22 176 lb 3.2 oz (79.9 kg)  02/01/22 177 lb 2 oz (80.3 kg)      I discussed the assessment and treatment plan with the patient. The patient was provided an opportunity to ask questions and all were answered. The patient agreed with the plan and demonstrated an understanding of the instructions.   The patient was advised to call back or seek an in-person evaluation if the symptoms worsen or if the condition fails to improve as anticipated.  02/03/22, NP [facility] [place] [phone] (phone) [phone] (fax)  [facility]

## 2022-05-25 NOTE — Assessment & Plan Note (Signed)
chronic pt requesting to wean off Zoloft 50mg , reports taking for 4 months now and feels like he is doing better, doing well in college denies SE as reason for stopping has not seen a therapist - advised this is very important d/t his past PTSD to have someone he can go to, talk things over with, especially now going off medication - advised to utilize counseling center on campus, usually a free service. sending 25mg  Zoloft - advised to take daily for 7 days, then stop, if any SE, restart and take 1 pill qod for 14 more days and let me know if more medication needed. f/u prn

## 2022-05-31 ENCOUNTER — Other Ambulatory Visit: Payer: Self-pay

## 2022-05-31 DIAGNOSIS — F902 Attention-deficit hyperactivity disorder, combined type: Secondary | ICD-10-CM

## 2022-05-31 MED ORDER — METHYLPHENIDATE HCL ER (LA) 40 MG PO CP24: 40.0000 mg | ORAL_CAPSULE | ORAL | 0 refills | Status: DC

## 2022-05-31 NOTE — Telephone Encounter (Signed)
[facility] mom would like [facility] sent to [facility] in [place], [place]

## 2022-05-31 NOTE — Telephone Encounter (Signed)
Ritalin LA 40 mg daily, #30 with no RF's.RX for above e-scribed and sent to pharmacy on record  [id number] [place], [place] - [address] AT [facility] & [address] [place] [address] Phone: [phone] Fax: [phone]

## 2022-06-30 ENCOUNTER — Encounter: Payer: Self-pay | Admitting: Family

## 2022-06-30 ENCOUNTER — Telehealth (INDEPENDENT_AMBULATORY_CARE_PROVIDER_SITE_OTHER): Payer: BC Managed Care – PPO | Admitting: Family

## 2022-06-30 DIAGNOSIS — R4681 Obsessive-compulsive behavior: Secondary | ICD-10-CM

## 2022-06-30 DIAGNOSIS — H02401 Unspecified ptosis of right eyelid: Secondary | ICD-10-CM | POA: Diagnosis not present

## 2022-06-30 DIAGNOSIS — H509 Unspecified strabismus: Secondary | ICD-10-CM

## 2022-06-30 DIAGNOSIS — R278 Other lack of coordination: Secondary | ICD-10-CM | POA: Diagnosis not present

## 2022-06-30 DIAGNOSIS — F902 Attention-deficit hyperactivity disorder, combined type: Secondary | ICD-10-CM | POA: Diagnosis not present

## 2022-06-30 DIAGNOSIS — H547 Unspecified visual loss: Secondary | ICD-10-CM

## 2022-06-30 MED ORDER — METHYLPHENIDATE HCL ER (LA) 40 MG PO CP24: 40.0000 mg | ORAL_CAPSULE | ORAL | 0 refills | Status: DC

## 2022-06-30 MED ORDER — METHYLPHENIDATE HCL 10 MG PO TABS: 10.0000 mg | ORAL_TABLET | Freq: Every day | ORAL | 0 refills | Status: DC

## 2022-06-30 NOTE — Progress Notes (Signed)
[facility]. [address] [place] [address] Dept: [phone] Dept Fax: [phone]  Medication Check visit via Virtual Video   Patient ID:  Daryl Allen  male DOB: 06-Sep-2000   21 y.o.   MRN: [id number]   DATE:06/30/22  PCP: Jeanie Sewer, NP    Virtual Visit via Video Note I connected with  Daryl Allen on 06/30/22 at  1:30 PM EST by a video enabled telemedicine application and verified that I am speaking with the correct person using two identifiers. Patient/Parent Location: at home  I discussed the limitations, risks, security and privacy concerns of performing an evaluation and management service by telephone and the availability of in person appointments. I also discussed with the parents that there may be a patient responsible charge related to this service. The parents expressed understanding and agreed to proceed.  Provider: Carolann Littler, NP  Location: private work location  HPI/CURRENT STATUS: Daryl Allen is here for medication management of the psychoactive medications for ADHD and review of educational and behavioral concerns.   Daryl Allen currently taking Ritalin LA 40 mg   which is working well. Takes medication in the morning. Medication tends to wear off around evening time and take his Ritalin 10 mg in the pm PRN. Daryl Allen is able to focus through school & homework.   Daryl Allen is eating well (eating breakfast, lunch and dinner). Daryl Allen does not have appetite suppression and getting plenty of calories.  Sleeping well (getting enough sleep), sleeping through the night. 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   MEDICAL HISTORY: Individual Medical History/ Review of Systems: Yes, off Zoloft   Has been healthy with no visits to the PCP. Daryl Allen due yearly.   Family Medical/ Social History: None reported Daryl Allen Lives with: parents when at home. On campus this year and 4 total last semester in an on campus apartment.   MENTAL HEALTH: Mental Health Issues: Anxiety and OCD symptoms discussed and off Zoloft now. Not in counseling currently.   Allergies: No Known Allergies  Current Medications:  Current Outpatient Medications  Medication Instructions   loratadine (CLARITIN) 10 mg, Oral, Daily   melatonin 1 mg, Oral, At bedtime PRN   methylphenidate (RITALIN LA) 40 mg, Oral, BH-each morning   methylphenidate (RITALIN) 10 mg, Oral, Daily after lunch   Medication Side Effects: None  DIAGNOSES:    ICD-10-CM   1. ADHD (attention deficit hyperactivity disorder), combined type  F90.2 methylphenidate (RITALIN LA) 40 MG 24 hr capsule    2. Dysgraphia  R27.8     3. Ptosis, right eyelid  H02.401     4. Strabismus  H50.9     5. Decreased visual acuity  H54.7     6. Obsessive behaviors  R46.81      ASSESSMENT:      Daryl Allen is a 21 year old male with a hsitory of ADHD and Dysgraphia He has been maintained on Ritalin LA 40 mg daily and 10 mg IR in the evening for homework PRN with efficacy. Not using the Ritalin LA 20 mg at this time Discontinued the Zoloft due to limited efficacy and felt like the symptoms dissipated  on their own. Not currently receiving counseling. Academically finished the semester stronger than he started with no support in place. 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 Going to the gym regularly and eating with no changes reported. Sleeping well with no concerns. No changes in medication or dosing.   PLAN/RECOMMENDATIONS:  Updates with school progress and completion of the fall semester.  Academics and difficulties discussed with the  beginning of the semester.  No current disability services in place and help is available at the school.  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    Discussed his OCD behaviors and intrusive thoughts with discontinuation of Zoloft.  Strongly encouraged patient to f/Allen with counseling to assist with coping mechanisms.  Sleep habits and current sleep schedule discussed with patient.   Medication management discussed with patient for current symptom management with no changes today.   Counseled medication pharmacokinetics, options, dosage, administration, desired effects, and possible side effects.   Ritalin LA 40 mg daily, # 30 with no RF's Ritalin 10 mg in the afternoon PRN, #30 with no RF's Zoloft discontinued RX for above e-scribed and sent to pharmacy on record  [id number] [place] - [address] [place] [address] Phone: [phone] Fax: [phone]  I discussed the assessment and treatment plan with Daryl Allen. Daryl Allen was provided an opportunity to ask questions and all were answered. Daryl Allen agreed with the plan and demonstrated an understanding of the instructions.  REVIEW OF CHART, FACE TO FACE CLINIC TIME AND DOCUMENTATION TIME DURING TODAY'S VISIT:  30 mins      NEXT APPOINTMENT:  3 month follow visit Telehealth OK  The patient was advised to call back or seek an in-person evaluation if the symptoms worsen or if the condition fails to improve as anticipated.   Carron Curie, NP

## 2022-08-24 ENCOUNTER — Other Ambulatory Visit: Payer: Self-pay

## 2022-08-24 DIAGNOSIS — F902 Attention-deficit hyperactivity disorder, combined type: Secondary | ICD-10-CM

## 2022-08-24 MED ORDER — METHYLPHENIDATE HCL ER (LA) 40 MG PO CP24: 40.0000 mg | ORAL_CAPSULE | ORAL | 0 refills | Status: DC

## 2022-08-24 MED ORDER — METHYLPHENIDATE HCL 10 MG PO TABS: 10.0000 mg | ORAL_TABLET | Freq: Every day | ORAL | 0 refills | Status: AC

## 2022-08-24 NOTE — Telephone Encounter (Signed)
Ritalin LA 40 mg daily #30 with no RF's and Ritalin 10 mg daily, #30 with no RF's.RX for above e-scribed and sent to pharmacy on record  [id number] [place], [place] [address] Phone: [phone] Fax: [phone]

## 2022-09-02 ENCOUNTER — Other Ambulatory Visit (INDEPENDENT_AMBULATORY_CARE_PROVIDER_SITE_OTHER): Payer: Self-pay

## 2022-09-02 DIAGNOSIS — F902 Attention-deficit hyperactivity disorder, combined type: Secondary | ICD-10-CM

## 2022-09-02 MED ORDER — METHYLPHENIDATE HCL ER (LA) 40 MG PO CP24: 40.0000 mg | ORAL_CAPSULE | ORAL | 0 refills | Status: DC

## 2022-09-02 NOTE — Telephone Encounter (Signed)
  Name of who is calling:Tiffany   Caller's Relationship to Patient:mother   Best contact number:[phone]  Provider they PV:[id number]   Reason for call:medication refill out of medication. Mom stated that it was called in but on back order until March as well of a few other places that do not have it at this time. [facility] [place] where Sujay is in school at and they have it in [place]. Mom would like to know if it could be called in there. She stated that he can't be without out it.      PRESCRIPTION REFILL ONLY  Name of prescription:Methylphenidate   [facility]acy[facility] [place], [place]   Phone number to pharmacy [phone]

## 2022-09-02 NOTE — Telephone Encounter (Signed)
Ritalin LA 40 mg daily, #30 with no RF's.RX for above e-scribed and sent to pharmacy on record  [place], [place] [address] Phone: [phone] Fax: [phone]

## 2022-09-02 NOTE — Telephone Encounter (Signed)
Seen on 06/30/22- unable to find his medication in stock locally but it is in stock in [place] where he is attending college. Left message for mom to confirm it is just the 40 mg dose- requested she call back if that is not correct

## 2022-09-12 ENCOUNTER — Telehealth: Payer: Self-pay | Admitting: Family

## 2022-09-12 DIAGNOSIS — F902 Attention-deficit hyperactivity disorder, combined type: Secondary | ICD-10-CM

## 2022-09-12 MED ORDER — METHYLPHENIDATE HCL ER (LA) 40 MG PO CP24: 40.0000 mg | ORAL_CAPSULE | ORAL | 0 refills | Status: AC

## 2022-09-12 MED ORDER — METHYLPHENIDATE HCL ER (LA) 40 MG PO CP24: 40.0000 mg | ORAL_CAPSULE | Freq: Every day | ORAL | 0 refills | Status: AC

## 2022-09-12 NOTE — Telephone Encounter (Signed)
Mother contacted provider about patient's inability to get through to the new office. Needing RF's for Ritalin LA 40 mg daily, #30 with no RF"s. Post dated 2 RX's for 10/11/22 & 11/11/22.RX for above e-scribed and sent to pharmacy on record  [place], [place] [address] Phone: [phone] Fax: [phone]

## 2022-10-03 ENCOUNTER — Telehealth: Payer: BC Managed Care – PPO | Admitting: Family

## 2022-12-29 ENCOUNTER — Institutional Professional Consult (permissible substitution): Payer: BC Managed Care – PPO | Admitting: Family

## 2023-02-09 ENCOUNTER — Encounter: Payer: Self-pay | Admitting: Family Medicine

## 2023-02-09 ENCOUNTER — Ambulatory Visit: Payer: BC Managed Care – PPO | Admitting: Family Medicine

## 2023-02-09 VITALS — BP 108/70 | HR 85 | Temp 99.0°F | Resp 16 | Ht 73.0 in | Wt 182.2 lb

## 2023-02-09 DIAGNOSIS — B356 Tinea cruris: Secondary | ICD-10-CM

## 2023-02-09 MED ORDER — NYSTATIN 100000 UNIT/GM EX CREA
1.0000 | TOPICAL_CREAM | Freq: Two times a day (BID) | CUTANEOUS | 1 refills | Status: AC
Start: 2023-02-09 — End: ?

## 2023-02-09 NOTE — Progress Notes (Signed)
Established Patient Office Visit  Subjective   Patient ID: Daryl Allen, male    DOB: 2001/03/17  Age: 22 y.o. MRN: [id number]  Chief Complaint  Patient presents with   Rash    X3 weeks, between thighs, rash is itchy, not raised, redness, no discharge. Pt states some smell.     HPI Discussed the use of AI scribe software for clinical note transcription with the patient, who gave verbal consent to proceed.  History of Present Illness   The patient presents with a persistent rash in the groin area that has been present for three weeks. Initially thought to be a heat rash, the rash has not improved with the use of Gold Bond powder or keeping the area dry with tissues. The rash is itchy and began after a trip to the beach, leading to the initial assumption that it was a sun rash. The patient occasionally works outside and has been trying to keep the area cool and dry. The patient has not sought previous medical attention for this issue.      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 08/13/2007--REVISION OF RIGHT FRONTALIS SLING   Ptosis of eyelid, right    Past Surgical History:  Procedure Laterality Date   EYE MUSCLE SURGERY     PTOSIS REPAIR     STRABISMUS SURGERY     Social History   Tobacco Use   Smoking status: Never   Smokeless tobacco: Never  Substance Use Topics   Alcohol use: Yes    Alcohol/week: 2.0 standard drinks of alcohol    Types: 1 Cans of beer, 1 Shots of  liquor per week    Comment: occasionally   Drug use: Yes    Types: Marijuana    Comment: history of marijuana use   Social History   Socioeconomic History   Marital status: Single    Spouse name: Not on file   Number of children: Not on file   Years of education: Not on file   Highest education level: Not on file  Occupational History   Not on file  Tobacco Use   Smoking status: Never   Smokeless tobacco: Never  Vaping Use   Vaping status: Not on file  Substance and Sexual Activity   Alcohol use: Yes    Alcohol/week: 2.0 standard drinks of alcohol    Types: 1 Cans of beer, 1 Shots of liquor per week    Comment: occasionally   Drug use: Yes    Types: Marijuana    Comment: history of marijuana use   Sexual activity: Not Currently    Birth control/protection: None  Other Topics Concern   Not on file  Social History Narrative   Not on file   Social Determinants of Health   Financial Resource Strain: Not on file  Food Insecurity: Not on file  Transportation Needs: Not on file  Physical Activity: Not on file  Stress: Not on file  Social Connections: Not on  file  Intimate Partner Violence: Not on file   Family Status  Relation Name Status   Father  Alive   PGF  (Not Specified)  No partnership data on file   Family History  Problem Relation Age of Onset   ADD / ADHD Father        Diagnosed in the third grade and was treated with Adderall for a number of years.   Anxiety disorder Paternal Grandfather    No Known Allergies    Review of Systems  Constitutional:  Negative for fever and malaise/fatigue.  HENT:  Negative for congestion.   Eyes:  Negative for blurred vision.  Respiratory:  Negative for cough and shortness of breath.   Cardiovascular:  Negative for chest pain, palpitations and leg swelling.  Gastrointestinal:  Negative for abdominal pain, blood in stool, nausea and vomiting.  Genitourinary:  Negative for dysuria and frequency.  Musculoskeletal:   Negative for back pain and falls.  Skin:  Positive for itching and rash.  Neurological:  Negative for dizziness, loss of consciousness and headaches.  Endo/Heme/Allergies:  Negative for environmental allergies.  Psychiatric/Behavioral:  Negative for depression. The patient is not nervous/anxious.       Objective:     BP 108/70 (BP Location: Left Arm, Patient Position: Sitting, Cuff Size: Normal)   Pulse 85   Temp 99 F (37.2 C) (Oral)   Resp 16   Ht 6\' 1"  (1.854 m)   Wt 182 lb 3.2 oz (82.6 kg)   SpO2 96%   BMI 24.04 kg/m  BP Readings from Last 3 Encounters:  02/09/23 108/70  03/02/22 117/77  02/01/22 124/77   Wt Readings from Last 3 Encounters:  02/09/23 182 lb 3.2 oz (82.6 kg)  05/25/22 176 lb (79.8 kg)  03/02/22 176 lb 3.2 oz (79.9 kg)   SpO2 Readings from Last 3 Encounters:  02/09/23 96%  03/02/22 100%  02/01/22 99%      Physical Exam Vitals and nursing note reviewed.  Constitutional:      General: He is not in acute distress.    Appearance: Normal appearance. He is well-developed.  HENT:     Head: Normocephalic and atraumatic.  Eyes:     General: No scleral icterus.       Right eye: No discharge.        Left eye: No discharge.  Cardiovascular:     Rate and Rhythm: Normal rate and regular rhythm.     Heart sounds: No murmur heard. Pulmonary:     Effort: Pulmonary effort is normal. No respiratory distress.     Breath sounds: Normal breath sounds.  Musculoskeletal:        General: Normal range of motion.     Cervical back: Normal range of motion and neck supple.     Right lower leg: No edema.     Left lower leg: No edema.  Skin:    General: Skin is warm and dry.     Findings: Erythema and rash present.  Neurological:     General: No focal deficit present.     Mental Status: He is alert and oriented to person, place, and time.  Psychiatric:        Mood and Affect: Mood normal.        Behavior: Behavior normal.        Thought Content: Thought  content normal.        Judgment: Judgment normal.      No results found for any visits on  02/09/23.  Last CBC Lab Results  Component Value Date   WBC 6.4 05/29/2014   HGB 12.8 05/29/2014   HCT 36.7 05/29/2014   MCV 84.4 05/29/2014   MCH 29.4 05/29/2014   RDW 12.0 05/29/2014   PLT 208 05/29/2014   Last metabolic panel Lab Results  Component Value Date   GLUCOSE 98 05/29/2014   NA 141 05/29/2014   K 4.2 05/29/2014   CL 103 05/29/2014   CO2 24 05/29/2014   BUN 17 05/29/2014   CREATININE 0.60 05/29/2014   GFRNONAA NOT CALCULATED 05/29/2014   CALCIUM 9.3 05/29/2014   PROT 6.9 05/29/2014   ALBUMIN 4.0 05/29/2014   BILITOT <0.2 (L) 05/29/2014   ALKPHOS 217 05/29/2014   AST 15 05/29/2014   ALT 8 05/29/2014   ANIONGAP 14 05/29/2014   Last lipids No results found for: "CHOL", "HDL", "LDLCALC", "LDLDIRECT", "TRIG", "CHOLHDL" Last hemoglobin A1c No results found for: "HGBA1C" Last thyroid functions No results found for: "TSH", "T3TOTAL", "T4TOTAL", "THYROIDAB" Last vitamin D No results found for: "25OHVITD2", "25OHVITD3", "VD25OH" Last vitamin B12 and Folate No results found for: "VITAMINB12", "FOLATE"    The ASCVD Risk score (Arnett DK, et al., 2019) failed to calculate for the following reasons:   The 2019 ASCVD risk score is only valid for ages 67 to 72    Assessment & Plan:   Problem List Items Addressed This Visit   None Visit Diagnoses     Tinea cruris    -  Primary   Relevant Medications   nystatin cream (MYCOSTATIN)     Assessment and Plan    Intertrigo (Yeast Infection): Persistent rash in the groin area for three weeks, initially thought to be heat rash. Likely due to heat and moisture. Itching present. -Apply prescribed antifungal cream. -Wear 100% cotton underwear to keep the area cool and dry. -Consider potential irritation from elastic in underwear or bathing suit. -Contact office if no improvement or if condition worsens.        No  follow-ups on file.    Donato Schultz, DO

## 2023-02-10 ENCOUNTER — Ambulatory Visit: Payer: BC Managed Care – PPO | Admitting: Family

## 2023-05-11 ENCOUNTER — Telehealth: Payer: Self-pay | Admitting: Family

## 2023-05-11 NOTE — Telephone Encounter (Signed)
yes, fine with me

## 2023-05-11 NOTE — Telephone Encounter (Signed)
This is fine by me.

## 2023-05-11 NOTE — Telephone Encounter (Signed)
Patient mom called in and wanted to transfer her son care to [facility]. She stated that they are closer to our office and would rather come here for convenience. Is this ok with the both of you? Thank you!

## 2023-05-12 NOTE — Telephone Encounter (Signed)
Patient has been scheduled

## 2023-07-06 ENCOUNTER — Encounter: Payer: Self-pay | Admitting: Nurse Practitioner

## 2023-07-06 ENCOUNTER — Ambulatory Visit: Payer: BC Managed Care – PPO | Admitting: Nurse Practitioner

## 2023-07-06 VITALS — BP 130/84 | HR 87 | Temp 98.2°F | Ht 72.25 in | Wt 174.0 lb

## 2023-07-06 DIAGNOSIS — Z0000 Encounter for general adult medical examination without abnormal findings: Secondary | ICD-10-CM | POA: Diagnosis not present

## 2023-07-06 DIAGNOSIS — H02401 Unspecified ptosis of right eyelid: Secondary | ICD-10-CM | POA: Diagnosis not present

## 2023-07-06 DIAGNOSIS — Z23 Encounter for immunization: Secondary | ICD-10-CM

## 2023-07-06 DIAGNOSIS — Z13220 Encounter for screening for lipoid disorders: Secondary | ICD-10-CM | POA: Diagnosis not present

## 2023-07-06 DIAGNOSIS — H6122 Impacted cerumen, left ear: Secondary | ICD-10-CM

## 2023-07-06 DIAGNOSIS — H509 Unspecified strabismus: Secondary | ICD-10-CM | POA: Diagnosis not present

## 2023-07-06 DIAGNOSIS — F902 Attention-deficit hyperactivity disorder, combined type: Secondary | ICD-10-CM | POA: Diagnosis not present

## 2023-07-06 DIAGNOSIS — G4700 Insomnia, unspecified: Secondary | ICD-10-CM

## 2023-07-06 LAB — CBC
HCT: 43.2 % (ref 39.0–52.0)
Hemoglobin: 14.5 g/dL (ref 13.0–17.0)
MCHC: 33.6 g/dL (ref 30.0–36.0)
MCV: 90.9 fL (ref 78.0–100.0)
Platelets: 159 10*3/uL (ref 150.0–400.0)
RBC: 4.75 Mil/uL (ref 4.22–5.81)
RDW: 12.3 % (ref 11.5–15.5)
WBC: 9 10*3/uL (ref 4.0–10.5)

## 2023-07-06 LAB — COMPREHENSIVE METABOLIC PANEL
ALT: 13 U/L (ref 0–53)
AST: 13 U/L (ref 0–37)
Albumin: 4.8 g/dL (ref 3.5–5.2)
Alkaline Phosphatase: 50 U/L (ref 39–117)
BUN: 12 mg/dL (ref 6–23)
CO2: 33 meq/L — ABNORMAL HIGH (ref 19–32)
Calcium: 9.7 mg/dL (ref 8.4–10.5)
Chloride: 100 meq/L (ref 96–112)
Creatinine, Ser: 1.1 mg/dL (ref 0.40–1.50)
GFR: 95.88 mL/min (ref >60.00)
Glucose, Bld: 84 mg/dL (ref 70–99)
Potassium: 4.2 meq/L (ref 3.5–5.1)
Sodium: 139 meq/L (ref 135–145)
Total Bilirubin: 0.6 mg/dL (ref 0.2–1.2)
Total Protein: 7.4 g/dL (ref 6.0–8.3)

## 2023-07-06 LAB — TSH: TSH: 2.93 u[IU]/mL (ref 0.35–5.50)

## 2023-07-06 LAB — LIPID PANEL
Cholesterol: 122 mg/dL (ref 0–200)
HDL: 58 mg/dL (ref >39.00)
LDL Cholesterol: 56 mg/dL (ref 0–99)
NonHDL: 63.79
Total CHOL/HDL Ratio: 2
Triglycerides: 41 mg/dL (ref 0.0–149.0)
VLDL: 8.2 mg/dL (ref 0.0–40.0)

## 2023-07-06 NOTE — Patient Instructions (Signed)
Nice to see you today I will be in touch with the labs once I have reviewed them Follow up with me in 1 year, sooner if you need me We did update your tetanus vaccine today

## 2023-07-06 NOTE — Assessment & Plan Note (Signed)
Verbal consent obtained.  Patient was prepped per office policy with cerumen softening eardrops.  A mixture of water and hydroperoxide was used ear was irrigated.  Patient tolerated procedure well.  Disimpaction was successful

## 2023-07-06 NOTE — Assessment & Plan Note (Signed)
History of the same with corrective surgeries.  Followed by eye doctor yearly

## 2023-07-06 NOTE — Assessment & Plan Note (Signed)
Discussed age-appropriate immunizations and screening exams.  Did review patient's personal, surgical, social, family histories.  Patient up-to-date on all age-appropriate vaccinations he would like.  Update tetanus vaccine today.  Patient declined flu vaccine.  Patient is too young for CRC screening or prostate cancer screening.  Patient was given information at discharge about preventative healthcare maintenance with anticipatory guidance.  Patient is not currently sexually active and declines STI testing.  Did review no drinking and driving, no texting and driving, and safe sex practices when he starts participating in sex.

## 2023-07-06 NOTE — Assessment & Plan Note (Signed)
Patient is followed by eye doctor every year.  Has had corrective surgery in the past

## 2023-07-06 NOTE — Assessment & Plan Note (Signed)
Patient is followed behavioral health specialist.  Currently maintained on methylphenidate 40 mg extended release methylphenidate 10 mg IR in the afternoon.  Continue following behavioral health specialist as recommended

## 2023-07-06 NOTE — Progress Notes (Signed)
Established Patient Office Visit  Subjective   Patient ID: Daryl Allen, male    DOB: June 10, 2001  Age: 22 y.o. MRN: [id number]  Chief Complaint  Patient presents with   Transitions Of Care    TOC from Dulce Sellar, NP at [facility].     HPI  ADHD: [name]- Gayla Medicus at [place] partner. Every 3 months.  Patient currently maintained on methylphenidate 40 mg extended release and methylphenidate 10 mg IR in the afternoon.  Allergies: xyzal he takes that as needed along with Claritin patient alternates  Insomnia: states that he takes melatonin that seems to help some. States that he takes it nightly but able to sleep without   Anxiety: hx of therapy in the past. He has been on anxiety pill in the past. States that he came off anxiety medication as his cousin had adverse drug event which caused him to want to harm himself.  Chest pain: patient states that it has been going on for 2 years. He is not concerned about it. He is unable to describe the discomfort. It will last for a mintues. No other accompanying symptoms. Negative family history for cardiac disease. He does mention that his [facility] provider has heard a heart murmur.  Patient declines to have this evaluated today  TDAP: 2014.  Update today Flu: refused  Covid: original series Pna: too young  Shingles: too young   Diet: he will do 3 meals a day sometimes 2. Some snacks throughout the day. He will drink water and some soda and coffee Exercise: tries to walk some/acitve. Gym 3-4  times a week 45-86mins  Eye: Sees eye doctor every year with contacts Dentist: every 6 months  STI/STD: no concern      Review of Systems  Constitutional:  Negative for chills and fever.  Respiratory:  Negative for shortness of breath.   Cardiovascular:  Positive for chest pain (weekly for a few minutes. discomfort). Negative for leg swelling.  Gastrointestinal:  Negative for abdominal pain, blood in stool, constipation, diarrhea, nausea and  vomiting.       BM daily   Genitourinary:  Negative for dysuria and hematuria.  Neurological:  Negative for tingling and headaches.  Psychiatric/Behavioral:  Negative for hallucinations and suicidal ideas.       Objective:     BP 130/84   Pulse 87   Temp 98.2 F (36.8 C) (Oral)   Ht 6' 0.25" (1.835 m)   Wt 174 lb (78.9 kg)   SpO2 99%   BMI 23.44 kg/m  BP Readings from Last 3 Encounters:  07/06/23 130/84  02/09/23 108/70  03/02/22 117/77   Wt Readings from Last 3 Encounters:  07/06/23 174 lb (78.9 kg)  02/09/23 182 lb 3.2 oz (82.6 kg)  05/25/22 176 lb (79.8 kg)   SpO2 Readings from Last 3 Encounters:  07/06/23 99%  02/09/23 96%  03/02/22 100%      Physical Exam Vitals and nursing note reviewed.  Constitutional:      Appearance: Normal appearance.  HENT:     Right Ear: Tympanic membrane, ear canal and external ear normal.     Left Ear: Ear canal and external ear normal. There is impacted cerumen.     Mouth/Throat:     Mouth: Mucous membranes are moist.     Pharynx: Oropharynx is clear.  Eyes:     Extraocular Movements: Extraocular movements intact.     Pupils: Pupils are equal, round, and reactive to light.     Comments:  Right sided ptosis and strabismus   Cardiovascular:     Rate and Rhythm: Normal rate and regular rhythm.     Pulses: Normal pulses.     Heart sounds: Normal heart sounds.  Pulmonary:     Effort: Pulmonary effort is normal.     Breath sounds: Normal breath sounds.  Abdominal:     General: Bowel sounds are normal. There is no distension.     Palpations: There is no mass.     Tenderness: There is no abdominal tenderness.     Hernia: No hernia is present.  Musculoskeletal:     Right lower leg: No edema.     Left lower leg: No edema.  Lymphadenopathy:     Cervical: No cervical adenopathy.  Skin:    General: Skin is warm.  Neurological:     General: No focal deficit present.     Mental Status: He is alert.     Deep Tendon Reflexes:      Reflex Scores:      Bicep reflexes are 2+ on the right side and 2+ on the left side.      Patellar reflexes are 2+ on the right side and 2+ on the left side.    Comments: Bilateral upper and lower extremity strength 5/5  Psychiatric:        Mood and Affect: Mood normal.        Behavior: Behavior normal.        Thought Content: Thought content normal.        Judgment: Judgment normal.      No results found for any visits on 07/06/23.    The ASCVD Risk score (Arnett DK, et al., 2019) failed to calculate for the following reasons:   The 2019 ASCVD risk score is only valid for ages 38 to 62    Assessment & Plan:   Problem List Items Addressed This Visit       Nervous and Auditory   Impacted cerumen of left ear   Verbal consent obtained.  Patient was prepped per office policy with cerumen softening eardrops.  A mixture of water and hydroperoxide was used ear was irrigated.  Patient tolerated procedure well.  Disimpaction was successful      Relevant Orders   Ear Lavage     Other   ADHD (attention deficit hyperactivity disorder), combined type (Chronic)   Patient is followed behavioral health specialist.  Currently maintained on methylphenidate 40 mg extended release methylphenidate 10 mg IR in the afternoon.  Continue following behavioral health specialist as recommended      Ptosis, right eyelid   Patient is followed by eye doctor every year.  Has had corrective surgery in the past      Strabismus   History of the same with corrective surgeries.  Followed by eye doctor yearly      Insomnia   Patient will use melatonin but does not have to have it all the time.  Continue over-the-counter melatonin as needed      Preventative health care - Primary   Discussed age-appropriate immunizations and screening exams.  Did review patient's personal, surgical, social, family histories.  Patient up-to-date on all age-appropriate vaccinations he would like.  Update tetanus vaccine  today.  Patient declined flu vaccine.  Patient is too young for CRC screening or prostate cancer screening.  Patient was given information at discharge about preventative healthcare maintenance with anticipatory guidance.  Patient is not currently sexually active and declines STI testing.  Did review no drinking and driving, no texting and driving, and safe sex practices when he starts participating in sex.      Relevant Orders   CBC   Comprehensive metabolic panel   TSH   Other Visit Diagnoses       Screening for lipid disorders       Relevant Orders   Lipid panel       Return in about 1 year (around 07/05/2024) for CPE and Labs.    Audria Nine, NP

## 2023-07-06 NOTE — Assessment & Plan Note (Signed)
Patient will use melatonin but does not have to have it all the time.  Continue over-the-counter melatonin as needed
